# Patient Record
Sex: Female | Born: 1975 | Race: Black or African American | Hispanic: No | Marital: Single | Smoking: Never smoker
Health system: Southern US, Community
[De-identification: ages and names within clinical notes are randomized; demographics above are authoritative.]

---

## 1999-10-07 ENCOUNTER — Other Ambulatory Visit: Admission: RE | Admit: 1999-10-07 | Discharge: 1999-10-07 | Payer: Self-pay | Admitting: Family Medicine

## 2001-06-27 ENCOUNTER — Other Ambulatory Visit: Admission: RE | Admit: 2001-06-27 | Discharge: 2001-06-27 | Payer: Self-pay | Admitting: Family Medicine

## 2002-09-19 ENCOUNTER — Other Ambulatory Visit: Admission: RE | Admit: 2002-09-19 | Discharge: 2002-09-19 | Payer: Self-pay | Admitting: Family Medicine

## 2002-09-21 ENCOUNTER — Encounter: Payer: Self-pay | Admitting: *Deleted

## 2002-09-22 ENCOUNTER — Encounter: Payer: Self-pay | Admitting: General Surgery

## 2002-09-22 ENCOUNTER — Encounter: Payer: Self-pay | Admitting: *Deleted

## 2002-09-22 ENCOUNTER — Inpatient Hospital Stay (HOSPITAL_COMMUNITY): Admission: EM | Admit: 2002-09-22 | Discharge: 2002-10-19 | Payer: Self-pay

## 2002-09-24 ENCOUNTER — Encounter: Payer: Self-pay | Admitting: General Surgery

## 2002-09-25 ENCOUNTER — Encounter: Payer: Self-pay | Admitting: Orthopedic Surgery

## 2002-09-26 ENCOUNTER — Encounter: Payer: Self-pay | Admitting: General Surgery

## 2002-09-27 ENCOUNTER — Encounter: Payer: Self-pay | Admitting: General Surgery

## 2002-10-09 ENCOUNTER — Encounter: Payer: Self-pay | Admitting: Orthopedic Surgery

## 2002-10-18 ENCOUNTER — Encounter: Payer: Self-pay | Admitting: Orthopedic Surgery

## 2004-03-20 ENCOUNTER — Encounter: Admission: RE | Admit: 2004-03-20 | Discharge: 2004-03-20 | Payer: Self-pay | Admitting: Family Medicine

## 2005-06-03 ENCOUNTER — Encounter: Admission: RE | Admit: 2005-06-03 | Discharge: 2005-06-03 | Payer: Self-pay | Admitting: Family Medicine

## 2006-08-29 ENCOUNTER — Other Ambulatory Visit: Admission: RE | Admit: 2006-08-29 | Discharge: 2006-08-29 | Payer: Self-pay | Admitting: Surgery

## 2006-12-14 ENCOUNTER — Ambulatory Visit (HOSPITAL_COMMUNITY): Admission: RE | Admit: 2006-12-14 | Discharge: 2006-12-15 | Payer: Self-pay | Admitting: General Surgery

## 2014-05-14 ENCOUNTER — Other Ambulatory Visit: Payer: Self-pay

## 2014-05-23 ENCOUNTER — Other Ambulatory Visit: Payer: Self-pay

## 2014-05-23 DIAGNOSIS — Z1231 Encounter for screening mammogram for malignant neoplasm of breast: Secondary | ICD-10-CM

## 2014-05-31 ENCOUNTER — Encounter (INDEPENDENT_AMBULATORY_CARE_PROVIDER_SITE_OTHER): Payer: Self-pay

## 2014-05-31 ENCOUNTER — Ambulatory Visit
Admission: RE | Admit: 2014-05-31 | Discharge: 2014-05-31 | Disposition: A | Discharge status: Home / Self Care | Payer: Private Health Insurance - Indemnity | Source: Ambulatory Visit

## 2014-05-31 DIAGNOSIS — Z1231 Encounter for screening mammogram for malignant neoplasm of breast: Secondary | ICD-10-CM

## 2015-02-06 ENCOUNTER — Other Ambulatory Visit (HOSPITAL_COMMUNITY)
Admission: RE | Admit: 2015-02-06 | Discharge: 2015-02-06 | Disposition: A | Discharge status: Home / Self Care | Payer: Private Health Insurance - Indemnity | Source: Ambulatory Visit | Attending: Family Medicine | Admitting: Family Medicine

## 2015-02-06 ENCOUNTER — Other Ambulatory Visit: Payer: Self-pay | Admitting: Family Medicine

## 2015-02-06 DIAGNOSIS — Z1151 Encounter for screening for human papillomavirus (HPV): Secondary | ICD-10-CM | POA: Insufficient documentation

## 2015-02-06 DIAGNOSIS — Z01419 Encounter for gynecological examination (general) (routine) without abnormal findings: Secondary | ICD-10-CM | POA: Diagnosis not present

## 2015-02-10 LAB — CYTOLOGY - PAP

## 2015-07-15 ENCOUNTER — Other Ambulatory Visit: Payer: Self-pay

## 2015-07-15 DIAGNOSIS — Z1231 Encounter for screening mammogram for malignant neoplasm of breast: Secondary | ICD-10-CM

## 2015-08-15 ENCOUNTER — Ambulatory Visit: Payer: Private Health Insurance - Indemnity

## 2015-09-22 ENCOUNTER — Ambulatory Visit
Admission: RE | Admit: 2015-09-22 | Discharge: 2015-09-22 | Disposition: A | Discharge status: Home / Self Care | Payer: 59 | Source: Ambulatory Visit

## 2015-09-22 DIAGNOSIS — Z1231 Encounter for screening mammogram for malignant neoplasm of breast: Secondary | ICD-10-CM

## 2016-08-17 ENCOUNTER — Other Ambulatory Visit: Payer: Self-pay | Admitting: Family Medicine

## 2016-08-17 DIAGNOSIS — Z1231 Encounter for screening mammogram for malignant neoplasm of breast: Secondary | ICD-10-CM

## 2016-09-24 ENCOUNTER — Ambulatory Visit
Admission: RE | Admit: 2016-09-24 | Discharge: 2016-09-24 | Disposition: A | Discharge status: Home / Self Care | Payer: 59 | Source: Ambulatory Visit | Attending: Family Medicine | Admitting: Family Medicine

## 2016-09-24 DIAGNOSIS — Z1231 Encounter for screening mammogram for malignant neoplasm of breast: Secondary | ICD-10-CM

## 2017-07-15 ENCOUNTER — Other Ambulatory Visit: Payer: Self-pay | Admitting: Family Medicine

## 2017-07-15 DIAGNOSIS — M7989 Other specified soft tissue disorders: Secondary | ICD-10-CM

## 2017-07-15 DIAGNOSIS — R2242 Localized swelling, mass and lump, left lower limb: Principal | ICD-10-CM

## 2017-07-25 ENCOUNTER — Ambulatory Visit
Admission: RE | Admit: 2017-07-25 | Discharge: 2017-07-25 | Disposition: A | Discharge status: Home / Self Care | Payer: 59 | Source: Ambulatory Visit | Attending: Family Medicine | Admitting: Family Medicine

## 2017-07-25 DIAGNOSIS — R2242 Localized swelling, mass and lump, left lower limb: Principal | ICD-10-CM

## 2017-07-25 DIAGNOSIS — M7989 Other specified soft tissue disorders: Secondary | ICD-10-CM

## 2017-10-14 ENCOUNTER — Other Ambulatory Visit: Payer: Self-pay | Admitting: Surgery

## 2018-01-05 DIAGNOSIS — Z0279 Encounter for issue of other medical certificate: Secondary | ICD-10-CM

## 2018-03-06 IMAGING — US US EXTREM LOW*L* LIMITED
1 series · 14 of 17 positions shown · non-contrast
Comparison: None.

CLINICAL DATA: Left lower extremity soft tissue mass. This has been
noted for 1 year now become tender.

EXAM:
ULTRASOUND LEFT LOWER EXTREMITY LIMITED
TECHNIQUE: Ultrasound examination of the lower extremity soft tissues was
performed in the area of clinical concern.

[Series 1: us extrem low*left* limited · 0.05mm/px · 14 of 17 slices shown]
[im 1/17]
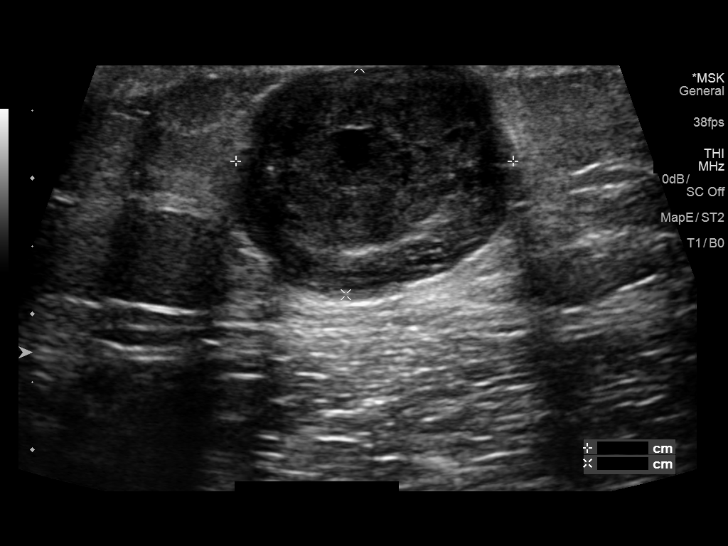
[im 2/17]
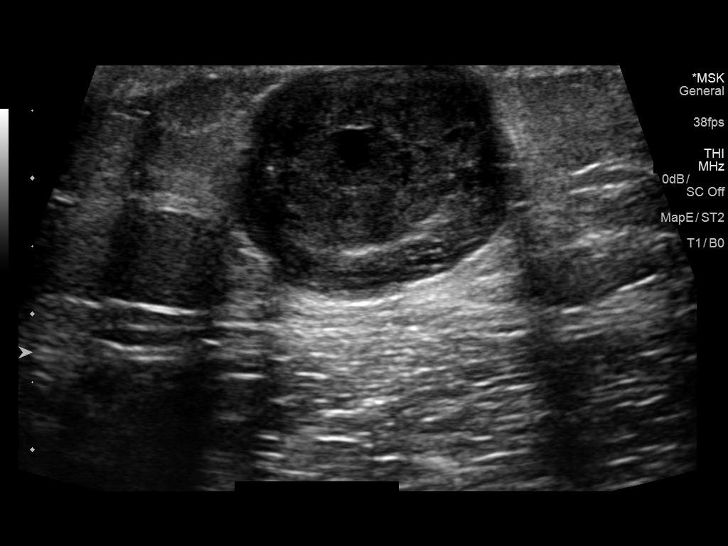
[im 4/17]
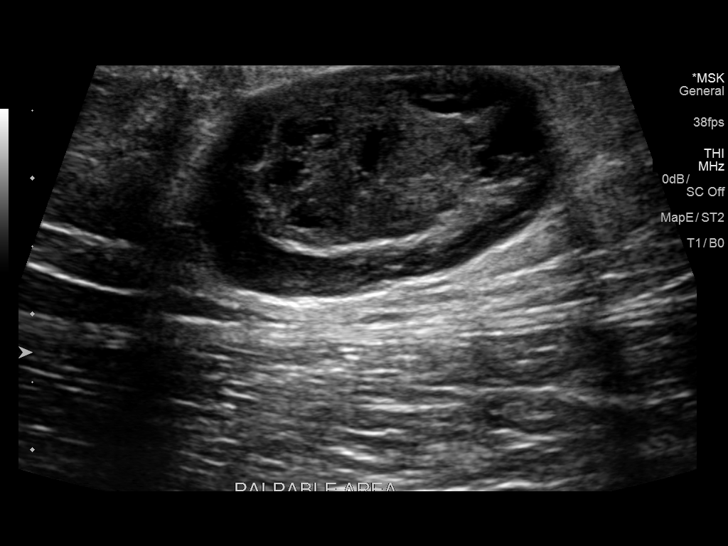
[im 5/17]
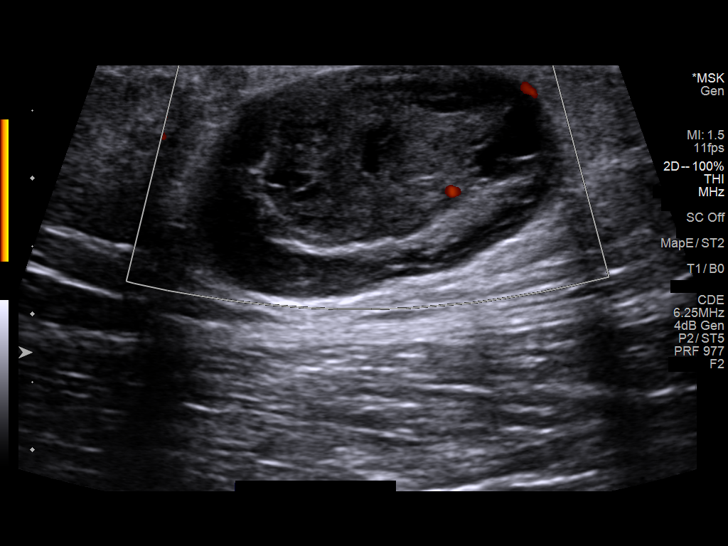
[im 6/17]
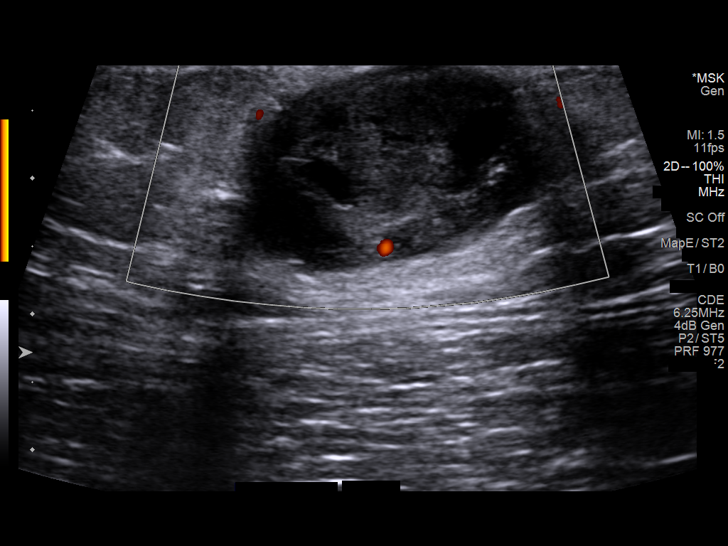
[im 7/17]
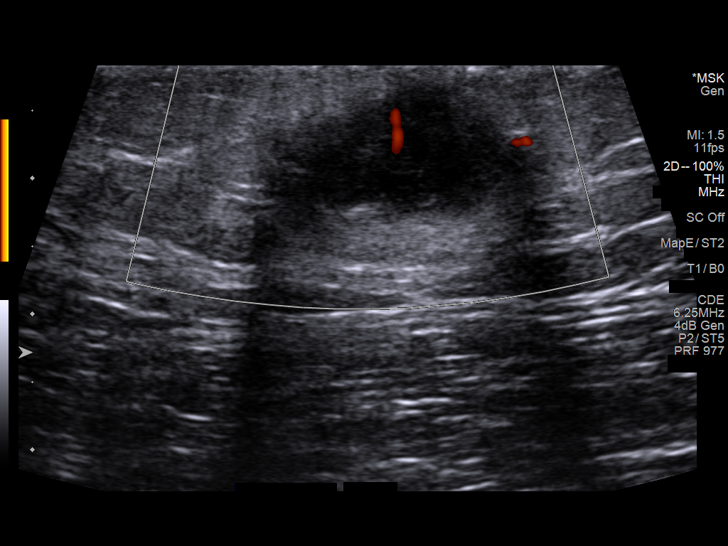
[im 8/17]
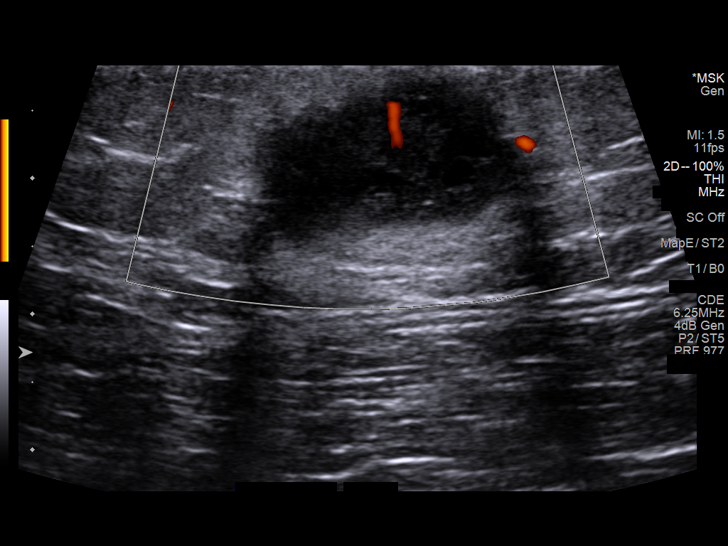
[im 10/17]
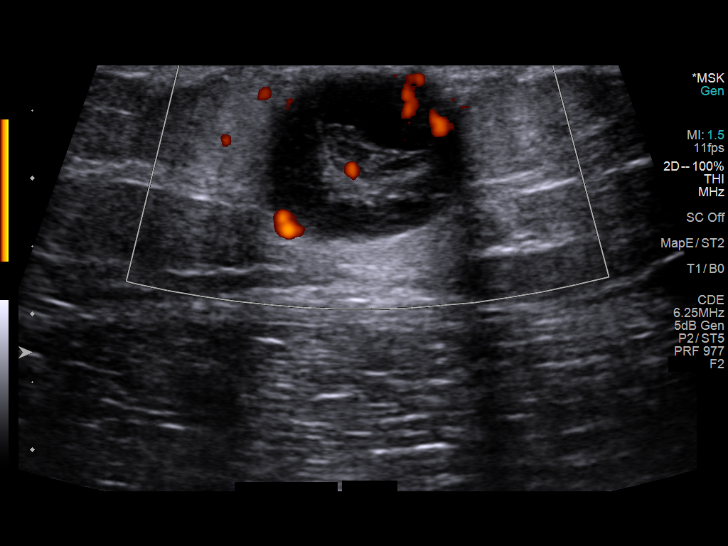
[im 11/17]
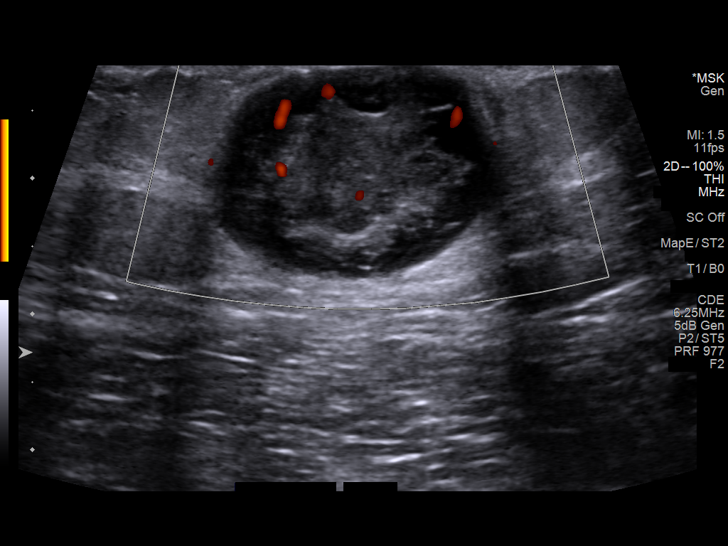
[im 12/17]
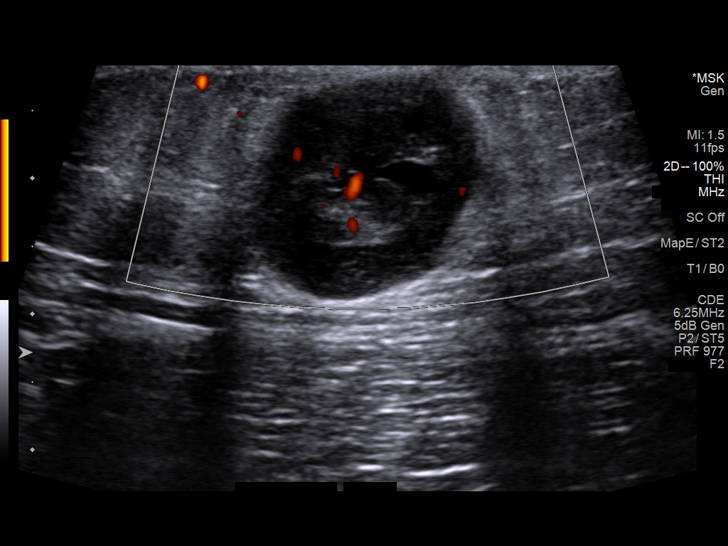
[im 13/17]
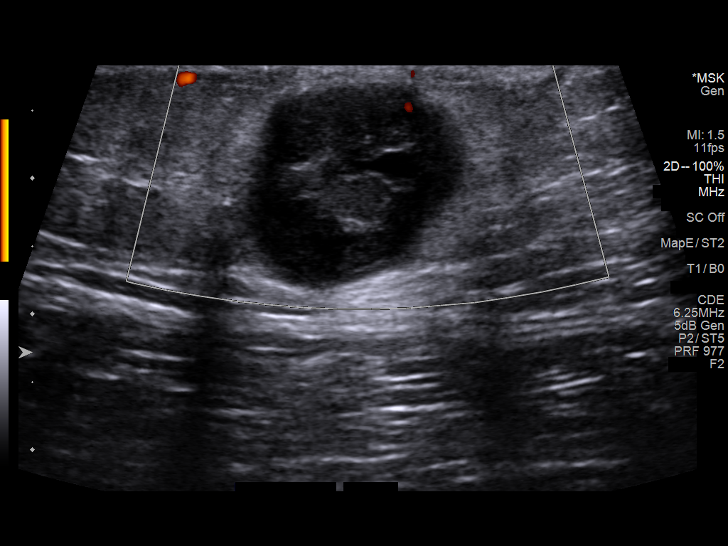
[im 14/17]
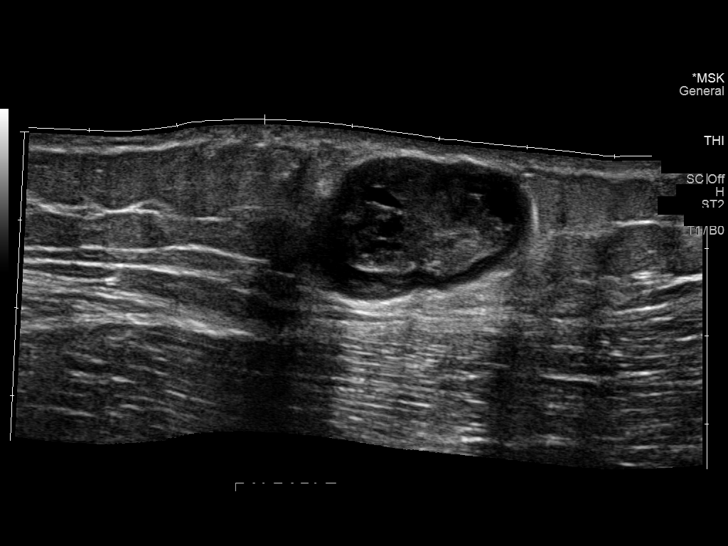
[im 16/17]
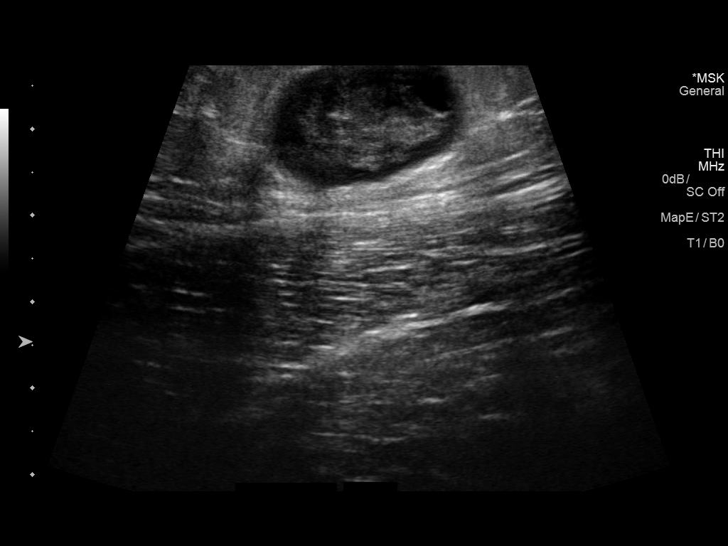
[im 17/17]
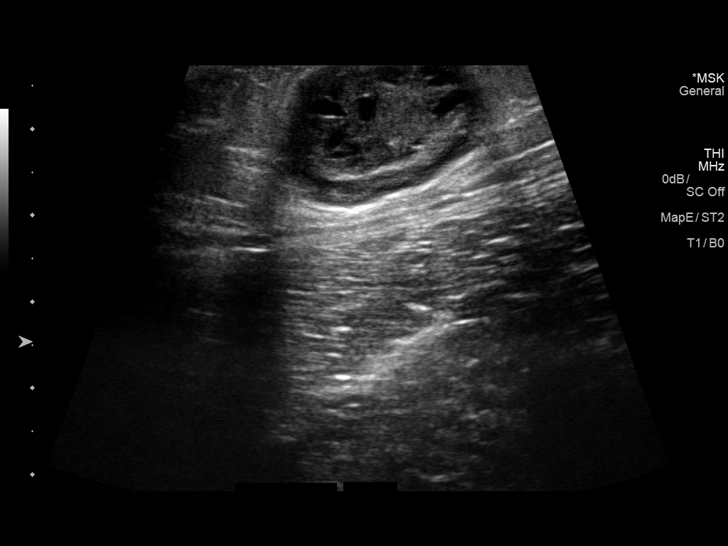

[14 of 17 positions shown; findings below may reference images not displayed]

FINDINGS: There is a hypoechoic heterogeneous mass with internal color Doppler
blood flow within left lateral thigh. This lies in the subcutaneous
soft tissues abutting the dermis. It measures 2.0 x 1.7 x 2.7 cm.
This corresponds to the palpable abnormality. The margins are
relatively well-defined.
IMPRESSION: Hypoechoic heterogeneous solid 2.7 cm mass in the subcutaneous soft
tissues of the left lateral thigh. Neoplastic disease is possible.
Recommend surgical consultation for biopsy/excision.

## 2019-06-01 DIAGNOSIS — Z0279 Encounter for issue of other medical certificate: Secondary | ICD-10-CM

## 2020-06-03 DIAGNOSIS — Z0279 Encounter for issue of other medical certificate: Secondary | ICD-10-CM

## 2020-07-08 ENCOUNTER — Ambulatory Visit: Payer: 59 | Attending: Critical Care Medicine

## 2020-07-08 DIAGNOSIS — Z23 Encounter for immunization: Secondary | ICD-10-CM

## 2020-07-08 NOTE — Progress Notes (Signed)
   Covid-19 Vaccination Clinic  Name:  Shelby Wilson    MRN: [id number] DOB: 10/20/1976  07/08/2020  [name] was observed post Covid-19 immunization for 15 minutes without incident. She was provided with Vaccine Information Sheet and instruction to access the V-Safe system.   [name] was instructed to call 911 with any severe reactions post vaccine: Marland Kitchen Difficulty breathing  . Swelling of face and throat  . A fast heartbeat  . A bad rash all over body  . Dizziness and weakness   Immunizations Administered    Name Date Dose VIS Date Route   Moderna COVID-19 Vaccine 07/08/2020  1:15 PM 0.5 mL 10/2019 Intramuscular   Manufacturer: Moderna   Lot: [id number]   NDC: 20355-974-16

## 2020-08-06 ENCOUNTER — Ambulatory Visit: Payer: 59 | Attending: Internal Medicine

## 2020-08-06 DIAGNOSIS — Z23 Encounter for immunization: Secondary | ICD-10-CM

## 2020-08-06 NOTE — Progress Notes (Signed)
   Covid-19 Vaccination Clinic  Name:  Shelby Wilson    MRN: [id number] DOB: 11-12-1975  08/06/2020  Ms. Imber was observed post Covid-19 immunization for 15 minutes without incident. She was provided with Vaccine Information Sheet and instruction to access the V-Safe system.   Ms. Finstad was instructed to call 911 with any severe reactions post vaccine: Marland Kitchen Difficulty breathing  . Swelling of face and throat  . A fast heartbeat  . A bad rash all over body  . Dizziness and weakness   Immunizations Administered    Name Date Dose VIS Date Route   Moderna COVID-19 Vaccine 08/06/2020  9:37 AM 0.5 mL 10/2019 Intramuscular   Manufacturer: Moderna   Lot: [id number]   NDC: 40768-088-11

## 2020-12-09 DIAGNOSIS — Z0279 Encounter for issue of other medical certificate: Secondary | ICD-10-CM

## 2021-01-07 ENCOUNTER — Other Ambulatory Visit: Payer: Self-pay

## 2021-01-07 ENCOUNTER — Ambulatory Visit: Payer: 59 | Attending: Critical Care Medicine

## 2021-01-07 DIAGNOSIS — Z23 Encounter for immunization: Secondary | ICD-10-CM

## 2021-01-07 NOTE — Progress Notes (Signed)
   Covid-19 Vaccination Clinic  Name:  Shelby Wilson    MRN: [id number] DOB: 03-Oct-1976  01/07/2021  [name] was observed post Covid-19 immunization for 15 minutes without incident. She was provided with Vaccine Information Sheet and instruction to access the V-Safe system.   [name] was instructed to call 911 with any severe reactions post vaccine: Marland Kitchen Difficulty breathing  . Swelling of face and throat  . A fast heartbeat  . A bad rash all over body  . Dizziness and weakness   Immunizations Administered    Name Date Dose VIS Date Route   Moderna Covid-19 Booster Vaccine 01/07/2021  2:00 PM 0.25 mL 08/27/2020 Intramuscular   Manufacturer: Moderna   Lot: [id number]   NDC: 54650-354-65

## 2021-05-04 ENCOUNTER — Other Ambulatory Visit: Payer: Self-pay | Admitting: Family Medicine

## 2021-05-04 DIAGNOSIS — Z1231 Encounter for screening mammogram for malignant neoplasm of breast: Secondary | ICD-10-CM

## 2021-05-20 ENCOUNTER — Ambulatory Visit
Admission: RE | Admit: 2021-05-20 | Discharge: 2021-05-20 | Disposition: A | Discharge status: Home / Self Care | Payer: 59 | Source: Ambulatory Visit | Attending: Family Medicine | Admitting: Family Medicine

## 2021-05-20 DIAGNOSIS — Z1231 Encounter for screening mammogram for malignant neoplasm of breast: Secondary | ICD-10-CM

## 2022-05-31 ENCOUNTER — Other Ambulatory Visit: Payer: Self-pay | Admitting: Family Medicine

## 2022-05-31 DIAGNOSIS — Z1231 Encounter for screening mammogram for malignant neoplasm of breast: Secondary | ICD-10-CM

## 2022-06-03 ENCOUNTER — Ambulatory Visit
Admission: RE | Admit: 2022-06-03 | Discharge: 2022-06-03 | Disposition: A | Discharge status: Home / Self Care | Payer: 59 | Source: Ambulatory Visit | Attending: Family Medicine | Admitting: Family Medicine

## 2022-06-03 DIAGNOSIS — Z1231 Encounter for screening mammogram for malignant neoplasm of breast: Secondary | ICD-10-CM

## 2022-10-07 IMAGING — MG MM DIGITAL SCREENING BILAT W/ TOMO AND CAD
8 series · 9 of 24 positions shown · non-contrast
Comparison: Previous exam(s).

CLINICAL DATA: Screening.

EXAM:
DIGITAL SCREENING BILATERAL MAMMOGRAM WITH TOMOSYNTHESIS AND CAD
TECHNIQUE: Bilateral screening digital craniocaudal and mediolateral oblique
mammograms were obtained. Bilateral screening digital breast
tomosynthesis was performed. The images were evaluated with
computer-aided detection.

[R MLO synth-2D]
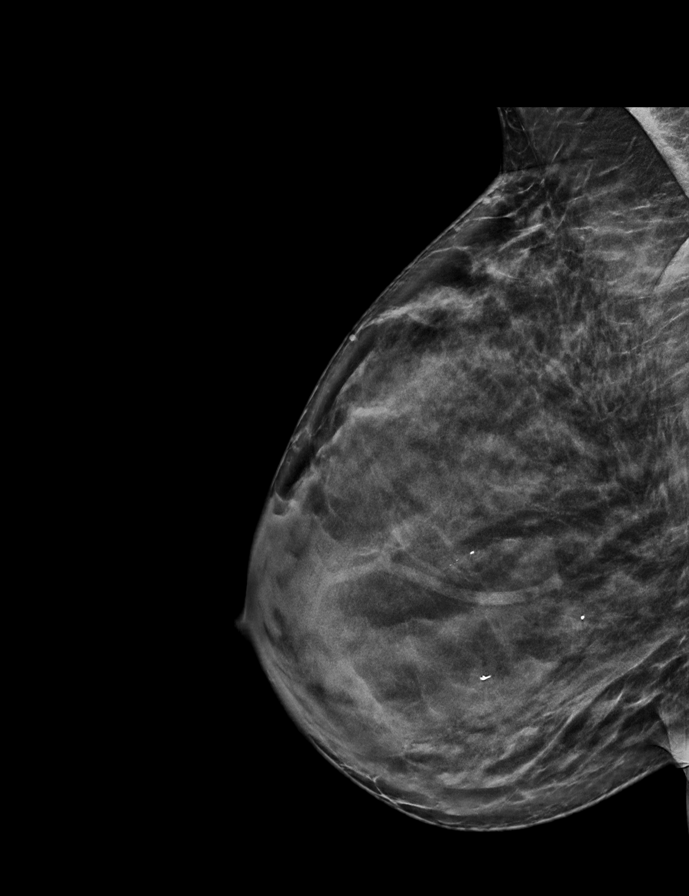

[R CC synth-2D]
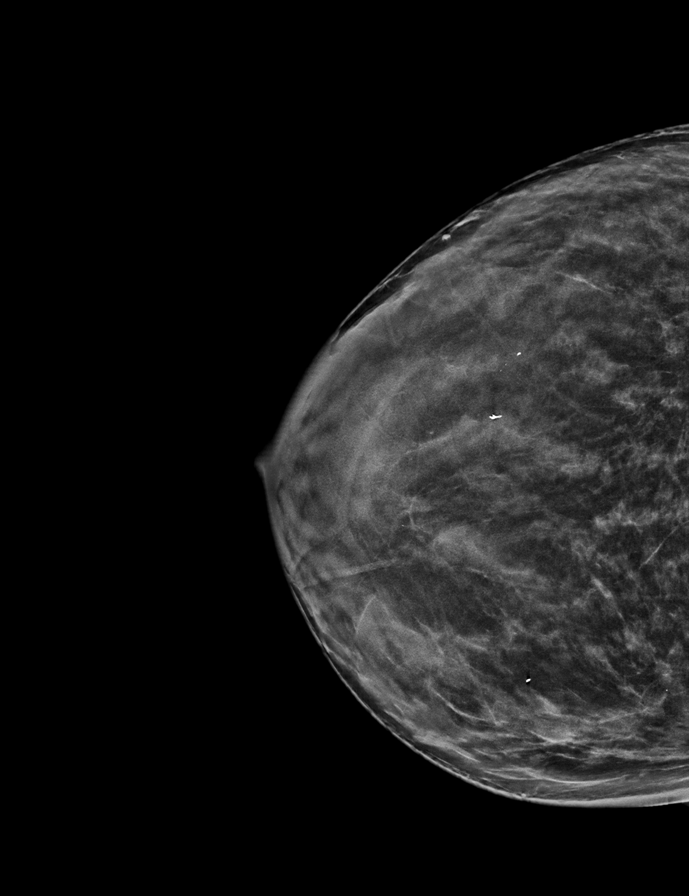

[L CC synth-2D]
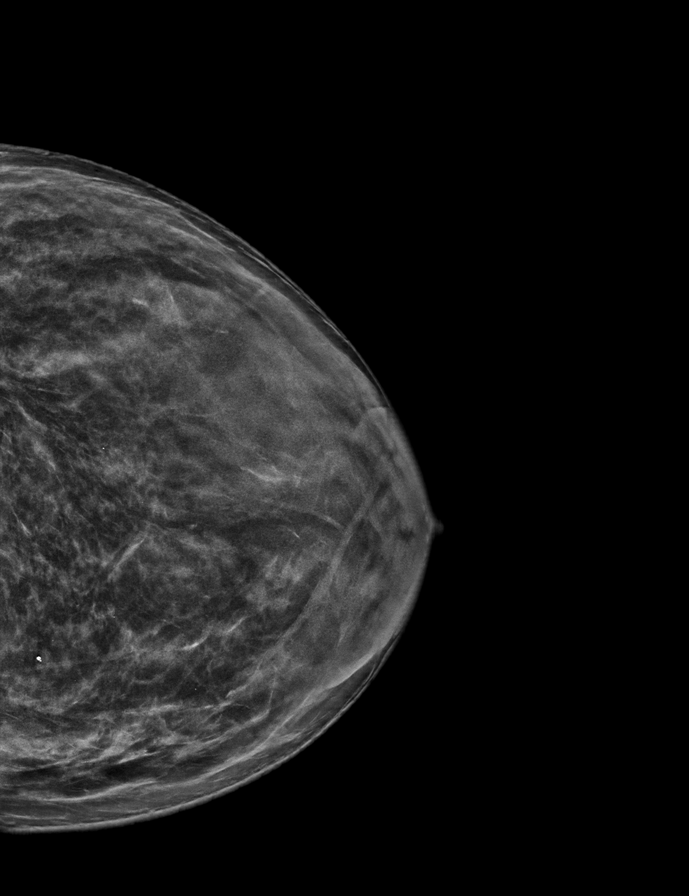

[L MLO synth-2D]
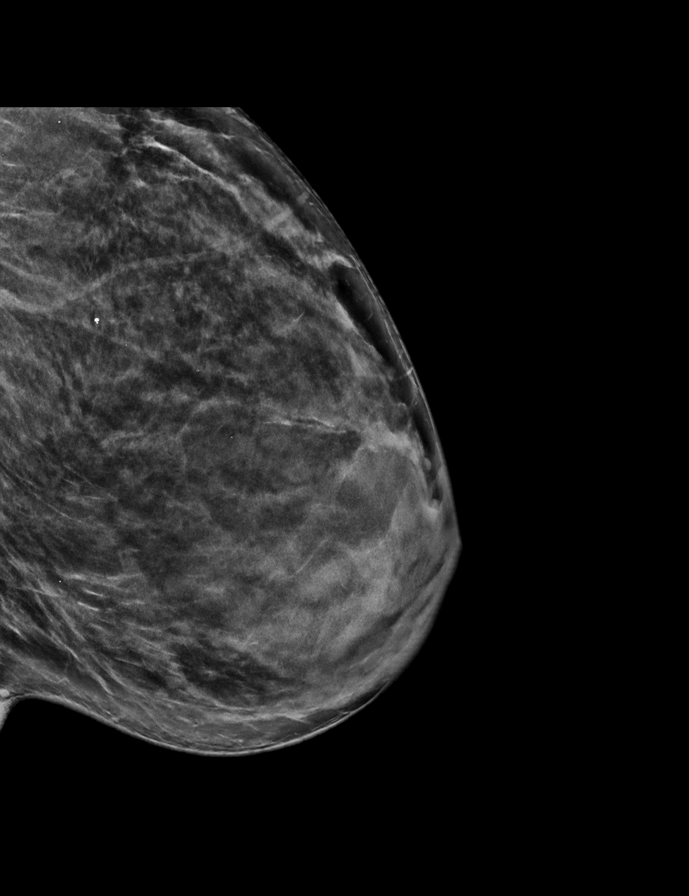

[R MLO tomo · 2 of 72 frames shown]
[frame 24/72]
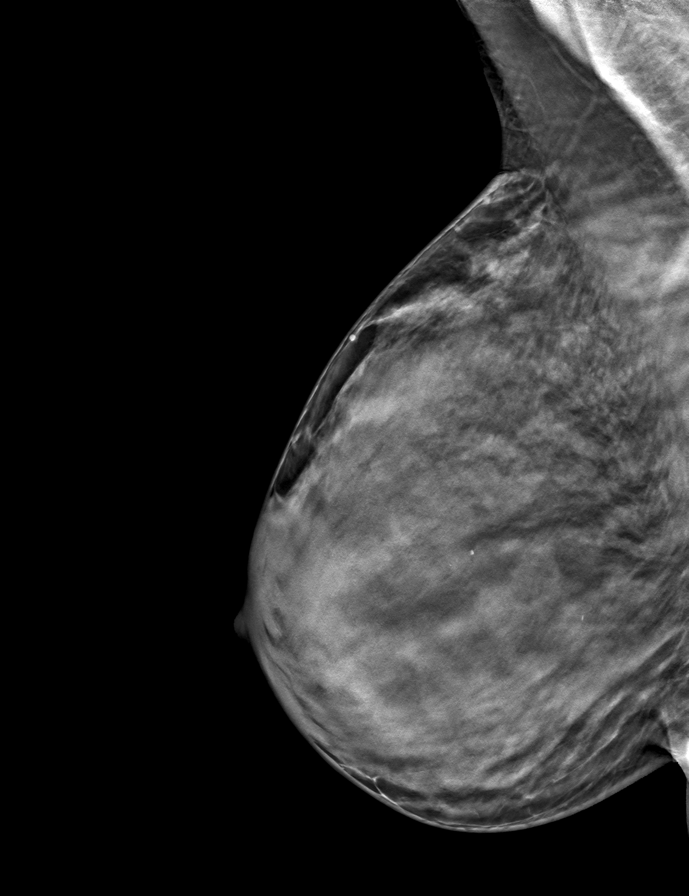
[frame 37/72]
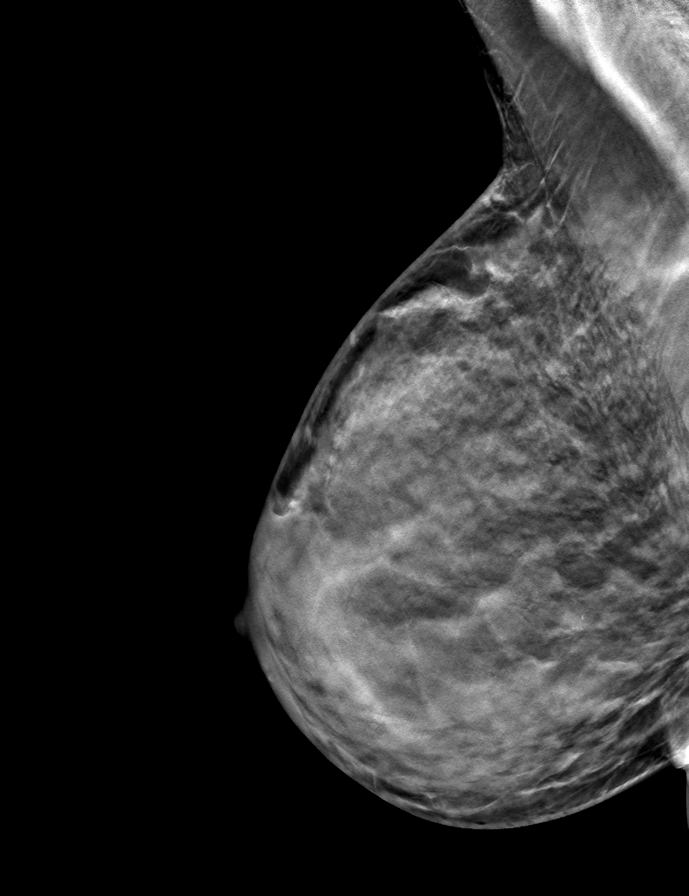

[L MLO tomo · tomo slice 35/68.0]
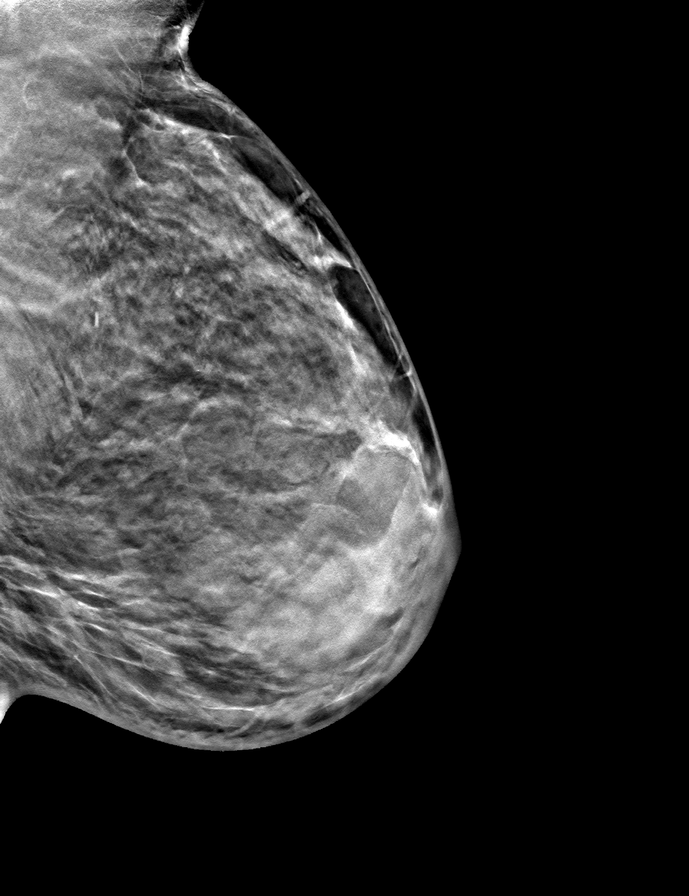

[L CC tomo · tomo slice 33/66.0]
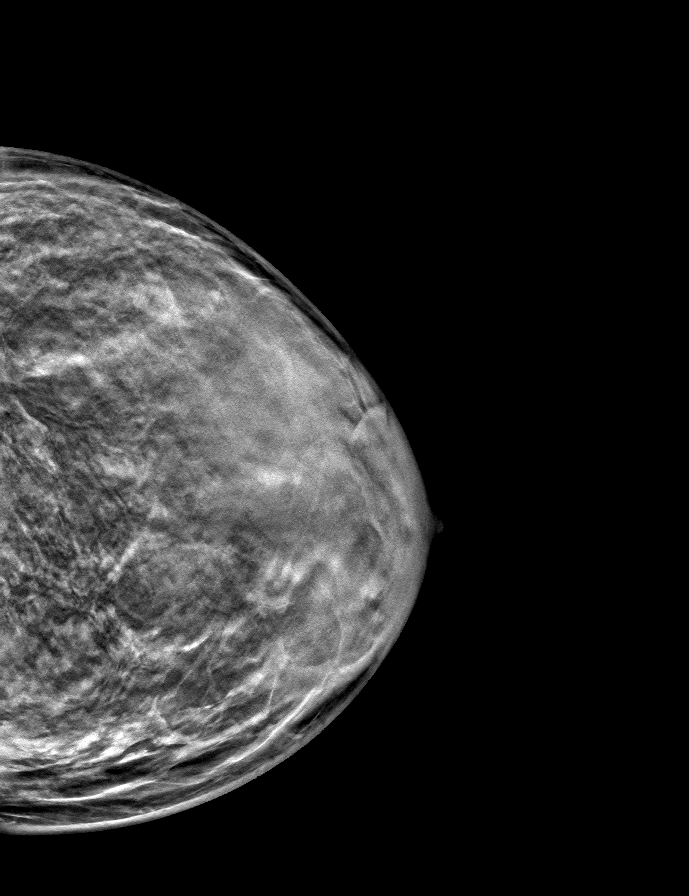

[R CC tomo · tomo slice 35/68.0]
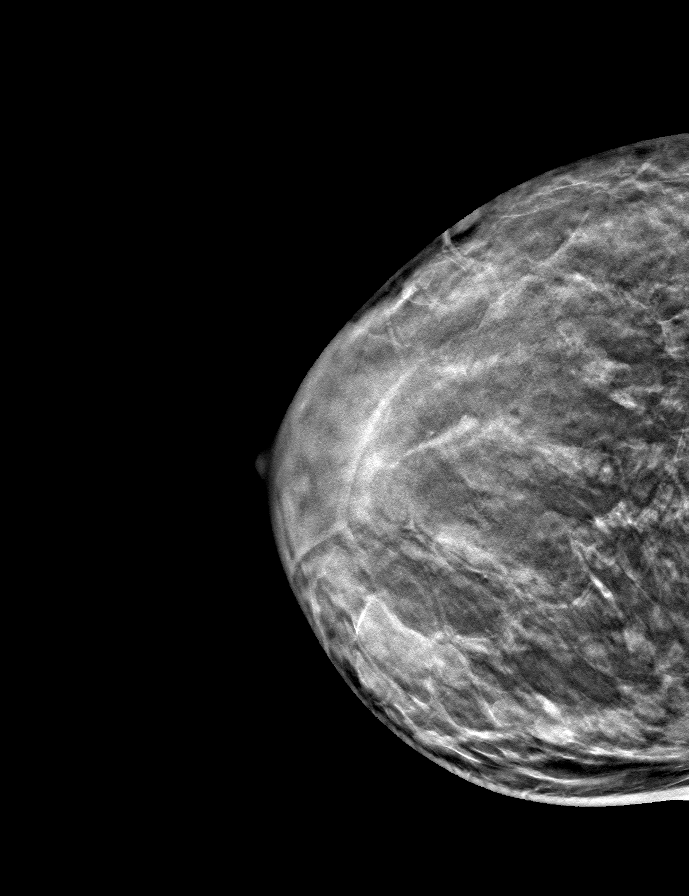

[9 of 24 positions shown; findings below may reference images not displayed]

ACR Breast Density Category d: The breast tissue is extremely dense,
which lowers the sensitivity of mammography
FINDINGS: There are no findings suspicious for malignancy.
IMPRESSION: No mammographic evidence of malignancy. A result letter of this
screening mammogram will be mailed directly to the patient.

RECOMMENDATION:
Screening mammogram in one year. (Code:[id number]-[id number])

BI-RADS CATEGORY  1: Negative.

## 2023-04-28 ENCOUNTER — Ambulatory Visit (INDEPENDENT_AMBULATORY_CARE_PROVIDER_SITE_OTHER): Payer: 59

## 2023-04-28 ENCOUNTER — Ambulatory Visit: Payer: 59 | Admitting: Podiatry

## 2023-04-28 DIAGNOSIS — M79671 Pain in right foot: Secondary | ICD-10-CM

## 2023-04-28 DIAGNOSIS — D492 Neoplasm of unspecified behavior of bone, soft tissue, and skin: Secondary | ICD-10-CM

## 2023-04-28 NOTE — Progress Notes (Signed)
Subjective:   Patient ID: Shelby Wilson, female   DOB: 47 y.o.   MRN: [id number]   HPI Chief Complaint  Patient presents with   skin growth     Right foot place on the side of foot that has gotten bigger over time some discomfort with certain shoes    47 year old female presents the office today for concerns of a growth on the medial aspect of her right foot.  She previously had an injury when she underwent ORIF of calcaneal fracture.  She states that she has small spot while back however this is gotten larger over time.  It does cause discomfort.  No recent treatment.  No injuries.   Review of Systems  All other systems reviewed and are negative.  No past medical history on file.  No past surgical history on file.   Current Outpatient Medications:    amLODipine (NORVASC) 5 MG tablet, Take 5 mg by mouth daily., Disp: , Rfl:   Not on File        Objective:  Physical Exam  General: AAO x3, NAD  Dermatological: Skin is warm, dry and supple bilateral. There are no open sores, no preulcerative lesions, no rash or signs of infection present.  Vascular: Dorsalis Pedis artery and Posterior Tibial artery pedal pulses are 2/4 bilateral with immedate capillary fill time. There is no pain with calf compression, swelling, warmth, erythema.   Neruologic: Grossly intact via light touch bilateral.   Musculoskeletal: Firm mass noted on the medial aspect of the right heel measuring 2 x 1.5 cm and is not mobile.  There is mild tenderness palpation on the area.  There is no fluid present there is no fluctuation, crepitation.    Gait: Unassisted, Nonantalgic.    Assessment:   Soft tissue mass right foot     Plan:  -Treatment options discussed including all alternatives, risks, and complications -Etiology of symptoms were discussed -X-rays were obtained and reviewed with the patient.  3 views of the foot were obtained.  Hardware intact with a prior calcaneal fracture.  Evidence of  acute fracture calcification present. -We discussed.  Treatment options were conservatively as well surgically.  I am going to order an ultrasound to see if they can further evaluate this area.  Tenderness we discussed steroid injection or excision of the lesion.  I do not think unable to get any fluid off of this today so I held off on aspiration.  I do not order an MRI given the hardware intact of the calcaneus  Vivi Barrack DPM

## 2023-04-28 NOTE — Patient Instructions (Signed)
I have ordered a ultrasound of the foot at [facility]. If you do not hear for them about scheduling within the next 1 week, or you have any questions please give [place] a call at [phone].

## 2023-05-13 ENCOUNTER — Ambulatory Visit
Admission: RE | Admit: 2023-05-13 | Discharge: 2023-05-13 | Disposition: A | Discharge status: Home / Self Care | Payer: 59 | Source: Ambulatory Visit | Attending: Podiatry | Admitting: Podiatry

## 2023-05-13 DIAGNOSIS — D492 Neoplasm of unspecified behavior of bone, soft tissue, and skin: Secondary | ICD-10-CM

## 2023-05-23 ENCOUNTER — Telehealth: Payer: Self-pay | Admitting: Podiatry

## 2023-05-23 NOTE — Telephone Encounter (Signed)
Attempted to call patient to go over results, no answer. Left VM to call back.

## 2023-05-24 ENCOUNTER — Telehealth: Payer: Self-pay | Admitting: Podiatry

## 2023-05-24 NOTE — Telephone Encounter (Signed)
Pt returning call from Dr.wagoner from yesterday please call her back.

## 2023-05-24 NOTE — Telephone Encounter (Signed)
Pt was returning Dr. Gabriel Rung phone call from yesterday , please give pt a call

## 2023-05-30 ENCOUNTER — Encounter: Payer: Self-pay | Admitting: Podiatry

## 2023-05-30 ENCOUNTER — Ambulatory Visit: Payer: 59 | Admitting: Podiatry

## 2023-05-30 VITALS — BP 168/98 | HR 58

## 2023-05-30 DIAGNOSIS — M7989 Other specified soft tissue disorders: Secondary | ICD-10-CM | POA: Diagnosis not present

## 2023-06-05 NOTE — Progress Notes (Signed)
Subjective: Chief Complaint  Patient presents with   Foot Pain    "It's okay."   47 year old female presents today for concerns.  She states that she was to proceed with a steroid injection of the soft tissue mass to see if this is beneficial before proceeding with surgery.   Objective: AAO x3, NAD DP/PT pulses palpable bilaterally, CRT less than 3 seconds Overall exam is unchanged.  There is a firm soft tissue mass noted along the medial aspect of the right heel.  No edema, erythema or sign of infection.  There is tenderness outpatient for the ablation she reports. No pain with calf compression, swelling, warmth, erythema  Assessment: Soft tissue mass right foot  Plan: -All treatment options discussed with the patient including all alternatives, risks, complications.  -Discussed both conservative as well as surgical options again.  She wants to try a steroid injection prior to this.  I cleaned skin with alcohol.  I then infiltrated 0.5 mL of Kenalog 10, 0.5 cc of lidocaine/Marcaine plain into the area of soft tissue mass or any complications.  Postinjection care discussed.  Tolerated well. -Patient encouraged to call the office with any questions, concerns, change in symptoms.   Vivi Barrack DPM

## 2023-06-27 ENCOUNTER — Ambulatory Visit: Payer: 59 | Admitting: Podiatry

## 2023-06-27 ENCOUNTER — Encounter: Payer: Self-pay | Admitting: Podiatry

## 2023-06-27 DIAGNOSIS — M7989 Other specified soft tissue disorders: Secondary | ICD-10-CM

## 2023-06-27 NOTE — Patient Instructions (Signed)

## 2023-06-27 NOTE — Progress Notes (Signed)
Subjective: Chief Complaint  Patient presents with   Foot Pain    Rm14: patient is here for lump/ cyst on inner right heel been there for over a year no pain, occasion tenderness mostly in the way    47 year old female presents today for concerns.  She is the area is not as painful as it was before the last injection.  About the same in size however.  No new concerns otherwise.    Objective: AAO x3, NAD DP/PT pulses palpable bilaterally, CRT less than 3 seconds Overall exam is unchanged.  There is a firm soft tissue mass noted along the medial aspect of the right heel.  No edema, erythema or sign of infection.  There is decreased tenderness compared to what it was last appointment.  Also somewhat softer in nature.  No surrounding erythema, drainage or pus or signs of pain.  No fluctuation.   No pain with calf compression, swelling, warmth, erythema  Assessment: Soft tissue mass right foot  Plan: -All treatment options discussed with the patient including all alternatives, risks, complications.  -We again discussed both conservative as well as surgical options.  She wants to try 1 more injection.  I then infiltrated 0.5 mL of Kenalog 10, 0.5 cc of lidocaine/Marcaine plain into the area of soft tissue mass or any complications.  Postinjection care discussed.  Tolerated well. -Patient encouraged to call the office with any questions, concerns, change in symptoms.   Vivi Barrack DPM

## 2023-07-13 ENCOUNTER — Encounter: Payer: Self-pay | Admitting: Podiatry

## 2023-07-13 NOTE — Telephone Encounter (Signed)
Can we schedule her a surgery consult? I have her scheduled for 08/10/23. Thanks!

## 2023-07-20 ENCOUNTER — Telehealth: Payer: Self-pay | Admitting: Podiatry

## 2023-07-20 NOTE — Telephone Encounter (Signed)
DOS-08/10/2023  EXCISION BENIGN LESION 2.O CM [id number]  [employer] EFFECTIVE DATE- 11/08/2002  DEDUCTIBLE-$ 600.00 WITH REMAINING $550.73  OOP- $4000.00 WITH REMAINING $3,807.38  COINSURANCE- 25%  PER [employer]'S AUTOMATED SYSTEM, PRIOR AUTH IS NOT REQUIRED FOR CPT CODE 47829.  CALL REF #: [phone]

## 2023-07-25 ENCOUNTER — Ambulatory Visit: Payer: 59 | Admitting: Podiatry

## 2023-07-28 ENCOUNTER — Encounter: Payer: Self-pay | Admitting: Podiatry

## 2023-08-05 ENCOUNTER — Ambulatory Visit: Payer: 59 | Admitting: Podiatry

## 2023-08-05 DIAGNOSIS — M7989 Other specified soft tissue disorders: Secondary | ICD-10-CM

## 2023-08-05 NOTE — Patient Instructions (Signed)

## 2023-08-08 NOTE — Progress Notes (Signed)
Subjective: Chief Complaint  Patient presents with   Soft tissue mass    Surgical consultation    47 year old female presents today for concerns.  She presents today for surgical consultation.  She is scheduled for surgery next week and given ongoing symptoms she wants to proceed with surgery to excise the soft tissue mass.  She states is not as tender as it was prior to getting injections and overall doing better but Sebasto still present causing symptoms and she wants to have this removed.    Objective: AAO x3, NAD DP/PT pulses palpable bilaterally, CRT less than 3 seconds There is a firm soft tissue mass noted along the medial aspect of the right heel.  Appears to be somewhat softer and more mobile today.  No edema, erythema or sign of infection.  There is decreased tenderness compared to what it was last appointment.  Also somewhat softer in nature.  No surrounding erythema, drainage or pus or signs of pain.  No fluctuation.   No pain with calf compression, swelling, warmth, erythema  Assessment: Soft tissue mass right foot  Plan: -All treatment options discussed with the patient including all alternatives, risks, complications.  -We again discussed both conservative as well as surgical options.  At this time she is attempted conservative treatment and significant improvement was proceed with surgical intervention.  We discussed soft tissue mass excision.  She is scheduled for surgery next week and she wants to proceed with this. -The incision placement as well as the postoperative course was discussed with the patient. I discussed risks of the surgery which include, but not limited to, infection, bleeding, pain, swelling, need for further surgery, delayed or nonhealing, painful or ugly scar, numbness or sensation changes, over/under correction, recurrence, transfer lesions, further deformity, hardware failure, DVT/PE, loss of toe/foot. Patient understands these risks and wishes to proceed  with surgery. The surgical consent was reviewed with the patient all 3 pages were signed. No promises or guarantees were given to the outcome of the procedure. All questions were answered to the best of my ability. Before the surgery the patient was encouraged to call the office if there is any further questions. [facility] basis.  Vivi Barrack DPM

## 2023-08-10 ENCOUNTER — Other Ambulatory Visit: Payer: Self-pay | Admitting: Podiatry

## 2023-08-10 DIAGNOSIS — D2371 Other benign neoplasm of skin of right lower limb, including hip: Secondary | ICD-10-CM | POA: Diagnosis not present

## 2023-08-10 MED ORDER — HYDROCODONE-ACETAMINOPHEN 5-325 MG PO TABS: 1.0000 | ORAL_TABLET | Freq: Four times a day (QID) | ORAL | 0 refills | Status: AC | PRN

## 2023-08-10 MED ORDER — PROMETHAZINE HCL 25 MG PO TABS: 25.0000 mg | ORAL_TABLET | Freq: Three times a day (TID) | ORAL | 0 refills | Status: AC | PRN

## 2023-08-10 MED ORDER — CEPHALEXIN 500 MG PO CAPS: 500.0000 mg | ORAL_CAPSULE | Freq: Three times a day (TID) | ORAL | 0 refills | Status: AC

## 2023-08-10 NOTE — Progress Notes (Signed)
Postop medications sent 

## 2023-08-15 ENCOUNTER — Ambulatory Visit (INDEPENDENT_AMBULATORY_CARE_PROVIDER_SITE_OTHER): Payer: 59

## 2023-08-15 ENCOUNTER — Ambulatory Visit (INDEPENDENT_AMBULATORY_CARE_PROVIDER_SITE_OTHER): Payer: 59 | Admitting: Podiatry

## 2023-08-15 ENCOUNTER — Encounter: Payer: Self-pay | Admitting: Podiatry

## 2023-08-15 VITALS — HR 86 | Temp 98.6°F | Resp 18 | Ht 67.0 in | Wt 140.0 lb

## 2023-08-15 DIAGNOSIS — Z9889 Other specified postprocedural states: Secondary | ICD-10-CM

## 2023-08-15 DIAGNOSIS — M7989 Other specified soft tissue disorders: Secondary | ICD-10-CM

## 2023-08-22 ENCOUNTER — Encounter: Payer: 59 | Admitting: Podiatry

## 2023-08-22 NOTE — Progress Notes (Signed)
Subjective: Chief Complaint  Patient presents with   Routine Post Op    RM11: POV # 1 DOS 08/10/2023 --- EXCISION BENIGN LESION 2.29 CM RT   47 year old female presents the office with above concerns.  States that she is doing well.  Does not report any fevers or chills.  Pain is currently controlled.  Objective: AAO x3, NAD DP/PT pulses palpable bilaterally, CRT less than 3 seconds Protective sensation intact with Simms Weinstein monofilament, vibratory sensation incision well coapted with sutures intact.  There is no surrounding erythema, ascending cellulitis there is no drainage or pus.  No significant pain on exam.  No recurrence of the soft tissue mass at this time. No pain with calf compression, swelling, warmth, erythema  Assessment: 47 year old female status post soft tissue mass excision  Plan: -All treatment options discussed with the patient including all alternatives, risks, complications.  -Antibiotic ointment dressing applied.  She will keep dressing clean, dry, intact.  However if she would like she can wash with soap and water and apply a similar bandage.  Continue cam boot and weight-bear as tolerated.  Ice, elevation as well as compression to help with the residual edema. -Awaiting pathology report -Patient encouraged to call the office with any questions, concerns, change in symptoms.   Vivi Barrack DPM

## 2023-08-25 ENCOUNTER — Encounter: Payer: Self-pay | Admitting: Podiatry

## 2023-08-29 ENCOUNTER — Ambulatory Visit (INDEPENDENT_AMBULATORY_CARE_PROVIDER_SITE_OTHER): Payer: 59 | Admitting: Podiatry

## 2023-08-29 DIAGNOSIS — M7989 Other specified soft tissue disorders: Secondary | ICD-10-CM

## 2023-08-31 NOTE — Progress Notes (Signed)
Subjective: No chief complaint on file.   47 year old female presents the office with above concerns.  She presents today for suture removal.  She has been feeling well.  She is open cam boot has been using crutches.  No fevers or chills.  Pain is controlled.  No other concerns.  Objective: AAO x3, NAD DP/PT pulses palpable bilaterally, CRT less than 3 seconds Incision well coapted with sutures intact.  There is no surrounding erythema, ascending cellulitis there is no drainage or pus.  No significant pain on exam.  No recurrence of the soft tissue mass at this time. No pain with calf compression, swelling, warmth, erythema  Assessment: 47 year old female status post soft tissue mass excision  Plan: -All treatment options discussed with the patient including all alternatives, risks, complications.  -Sutures removed incisions healing well.  Steri-Strips applied for reinforcement.  Antibiotic ointment dressing applied.  Discussed she can wash with soap and water and apply a similar bandage.  Weight-bear as tolerated in cam boot for now but once the incisions fully healed and scar is formed to conservative gradually transition to regular shoe as tolerated.  Monitoring signs or symptoms of infection.  I again reviewed the pathology report with her.   Shelby Wilson DPM

## 2023-09-05 ENCOUNTER — Encounter: Payer: 59 | Admitting: Podiatry

## 2023-09-12 ENCOUNTER — Ambulatory Visit (INDEPENDENT_AMBULATORY_CARE_PROVIDER_SITE_OTHER): Payer: 59

## 2023-09-12 ENCOUNTER — Ambulatory Visit (INDEPENDENT_AMBULATORY_CARE_PROVIDER_SITE_OTHER): Payer: 59 | Admitting: Podiatry

## 2023-09-12 DIAGNOSIS — Z9889 Other specified postprocedural states: Secondary | ICD-10-CM

## 2023-09-12 DIAGNOSIS — M7989 Other specified soft tissue disorders: Secondary | ICD-10-CM

## 2023-09-14 NOTE — Progress Notes (Signed)
Subjective: Chief Complaint  Patient presents with   Post-op Follow-up    POV # 3 Right foot no issues at this time doing well incision seem tough.     47 year old female presents the office with above concerns.  York Spaniel that she is doing well.  She is still walking the cam boot.  No significant pain.  No fevers or chills.  No other concerns.  Objective: AAO x3, NAD DP/PT pulses palpable bilaterally, CRT less than 3 seconds Incision well coapted with sutures intact.  The scar is somewhat hypertrophic at this time.  There is no surrounding erythema, ascending size.  No drainage or pus or any signs of infection.  No significant pain on exam. No pain with calf compression, swelling, warmth, erythema  Assessment: 47 year old female status post soft tissue mass excision  Plan: -All treatment options discussed with the patient including all alternatives, risks, complications.  -X-rays obtained reviewed.  2 views were obtained.  Hardware from prior surgery noted.  No subacute fracture otherwise or calcifications or foreign body. -At this time we discussed she can transition to regular shoe as tolerated.  As far as the scar goes we discussed over-the-counter scar creams.  Discussed vitamin E oil/moisturizer to massage of the incision as well -At this time we will see her back on as-needed basis but encouraged her to call any questions or concerns or any changes in the meantime.  Return if symptoms worsen or fail to improve.   Vivi Barrack DPM

## 2023-09-16 ENCOUNTER — Encounter: Payer: Self-pay | Admitting: Podiatry

## 2024-05-07 ENCOUNTER — Other Ambulatory Visit: Payer: Self-pay | Admitting: Family Medicine

## 2024-05-07 DIAGNOSIS — Z0000 Encounter for general adult medical examination without abnormal findings: Secondary | ICD-10-CM

## 2024-06-05 ENCOUNTER — Ambulatory Visit
Admission: RE | Admit: 2024-06-05 | Discharge: 2024-06-05 | Disposition: A | Discharge status: Home / Self Care | Source: Ambulatory Visit | Attending: Family Medicine | Admitting: Family Medicine

## 2024-06-05 DIAGNOSIS — Z0000 Encounter for general adult medical examination without abnormal findings: Secondary | ICD-10-CM
